# Patient Record
Sex: Male | Born: 1960 | Race: White | Hispanic: No | Marital: Married | Smoking: Never smoker
Health system: Southern US, Community
[De-identification: ages and names within clinical notes are randomized; demographics above are authoritative.]

## PROBLEM LIST (undated history)

## (undated) DIAGNOSIS — N200 Calculus of kidney: Secondary | ICD-10-CM

## (undated) DIAGNOSIS — K219 Gastro-esophageal reflux disease without esophagitis: Secondary | ICD-10-CM

## (undated) DIAGNOSIS — Z87442 Personal history of urinary calculi: Secondary | ICD-10-CM

## (undated) DIAGNOSIS — T8859XA Other complications of anesthesia, initial encounter: Secondary | ICD-10-CM

## (undated) DIAGNOSIS — I10 Essential (primary) hypertension: Secondary | ICD-10-CM

## (undated) HISTORY — PX: PROSTATE SURGERY: SHX751

## (undated) HISTORY — PX: LITHOTRIPSY: SUR834

---

## 2010-11-28 ENCOUNTER — Emergency Department (HOSPITAL_COMMUNITY)
Admission: EM | Admit: 2010-11-28 | Discharge: 2010-11-29 | Disposition: A | Discharge status: Home / Self Care | Payer: BC Managed Care – PPO | Attending: Emergency Medicine | Admitting: Emergency Medicine

## 2010-11-28 DIAGNOSIS — Z87442 Personal history of urinary calculi: Secondary | ICD-10-CM | POA: Insufficient documentation

## 2010-11-28 DIAGNOSIS — R339 Retention of urine, unspecified: Secondary | ICD-10-CM | POA: Insufficient documentation

## 2010-11-28 DIAGNOSIS — N390 Urinary tract infection, site not specified: Secondary | ICD-10-CM | POA: Insufficient documentation

## 2010-11-28 LAB — URINALYSIS, ROUTINE W REFLEX MICROSCOPIC
Glucose, UA: NEGATIVE mg/dL
Ketones, ur: NEGATIVE mg/dL
Nitrite: POSITIVE — AB
Protein, ur: NEGATIVE mg/dL

## 2010-11-28 LAB — URINE MICROSCOPIC-ADD ON

## 2010-12-01 LAB — URINE CULTURE: Culture  Setup Time: 201207250405

## 2010-12-03 ENCOUNTER — Emergency Department (INDEPENDENT_AMBULATORY_CARE_PROVIDER_SITE_OTHER): Payer: BC Managed Care – PPO

## 2010-12-03 ENCOUNTER — Emergency Department (HOSPITAL_BASED_OUTPATIENT_CLINIC_OR_DEPARTMENT_OTHER)
Admission: EM | Admit: 2010-12-03 | Discharge: 2010-12-04 | Disposition: A | Discharge status: Home / Self Care | Payer: BC Managed Care – PPO | Attending: Emergency Medicine | Admitting: Emergency Medicine

## 2010-12-03 ENCOUNTER — Encounter: Payer: Self-pay | Admitting: *Deleted

## 2010-12-03 DIAGNOSIS — N1330 Unspecified hydronephrosis: Secondary | ICD-10-CM

## 2010-12-03 DIAGNOSIS — N200 Calculus of kidney: Secondary | ICD-10-CM

## 2010-12-03 DIAGNOSIS — M549 Dorsalgia, unspecified: Secondary | ICD-10-CM | POA: Insufficient documentation

## 2010-12-03 DIAGNOSIS — R319 Hematuria, unspecified: Secondary | ICD-10-CM | POA: Insufficient documentation

## 2010-12-03 DIAGNOSIS — R109 Unspecified abdominal pain: Secondary | ICD-10-CM | POA: Insufficient documentation

## 2010-12-03 DIAGNOSIS — N201 Calculus of ureter: Secondary | ICD-10-CM

## 2010-12-03 HISTORY — DX: Calculus of kidney: N20.0

## 2010-12-03 LAB — DIFFERENTIAL
Eosinophils Absolute: 0.2 10*3/uL (ref 0.0–0.7)
Eosinophils Relative: 2 % (ref 0–5)
Lymphs Abs: 1.4 10*3/uL (ref 0.7–4.0)
Monocytes Absolute: 1.2 10*3/uL — ABNORMAL HIGH (ref 0.1–1.0)
Monocytes Relative: 13 % — ABNORMAL HIGH (ref 3–12)

## 2010-12-03 LAB — BASIC METABOLIC PANEL
BUN: 15 mg/dL (ref 6–23)
CO2: 28 mEq/L (ref 19–32)
Calcium: 9.9 mg/dL (ref 8.4–10.5)
Creatinine, Ser: 1.4 mg/dL — ABNORMAL HIGH (ref 0.50–1.35)
GFR calc non Af Amer: 53 mL/min — ABNORMAL LOW (ref >60)
Glucose, Bld: 76 mg/dL (ref 70–99)

## 2010-12-03 LAB — CBC
HCT: 43.1 % (ref 39.0–52.0)
Hemoglobin: 14.8 g/dL (ref 13.0–17.0)
MCH: 28.6 pg (ref 26.0–34.0)
MCV: 83.4 fL (ref 78.0–100.0)
Platelets: 225 10*3/uL (ref 150–400)
RBC: 5.17 MIL/uL (ref 4.22–5.81)

## 2010-12-03 LAB — URINE MICROSCOPIC-ADD ON

## 2010-12-03 LAB — URINALYSIS, ROUTINE W REFLEX MICROSCOPIC
Bilirubin Urine: NEGATIVE
Glucose, UA: NEGATIVE mg/dL
Specific Gravity, Urine: 1.028 (ref 1.005–1.030)
Urobilinogen, UA: 0.2 mg/dL (ref 0.0–1.0)

## 2010-12-03 MED ORDER — ONDANSETRON HCL 4 MG PO TABS: 4.0000 mg | ORAL_TABLET | Freq: Four times a day (QID) | ORAL | Status: AC

## 2010-12-03 MED ORDER — HYDROMORPHONE HCL 1 MG/ML IJ SOLN
1.0000 mg | Freq: Once | INTRAMUSCULAR | Status: AC
  Administered 2010-12-03: 1 mg via INTRAVENOUS
  Filled 2010-12-03: qty 1

## 2010-12-03 MED ORDER — KETOROLAC TROMETHAMINE 30 MG/ML IJ SOLN
30.0000 mg | Freq: Once | INTRAMUSCULAR | Status: AC
  Administered 2010-12-03: 30 mg via INTRAVENOUS
  Filled 2010-12-03: qty 1

## 2010-12-03 MED ORDER — SODIUM CHLORIDE 0.9 % IV BOLUS (SEPSIS)
1000.0000 mL | Freq: Once | INTRAVENOUS | Status: AC
  Administered 2010-12-03: 1000 mL via INTRAVENOUS

## 2010-12-03 MED ORDER — ONDANSETRON HCL 4 MG/2ML IJ SOLN
4.0000 mg | Freq: Once | INTRAMUSCULAR | Status: AC
  Administered 2010-12-03: 4 mg via INTRAVENOUS
  Filled 2010-12-03: qty 2

## 2010-12-03 MED ORDER — DEXTROSE 5 % IV SOLN
1.0000 g | Freq: Once | INTRAVENOUS | Status: AC
  Administered 2010-12-04: 1 g via INTRAVENOUS
  Filled 2010-12-03 (×2): qty 1

## 2010-12-03 MED ORDER — OXYCODONE-ACETAMINOPHEN 5-325 MG PO TABS: 2.0000 | ORAL_TABLET | ORAL | Status: AC | PRN

## 2010-12-03 NOTE — ED Notes (Signed)
Pt unable to urinate at this time.  

## 2010-12-03 NOTE — ED Provider Notes (Signed)
History     Chief Complaint  Patient presents with  . Back Pain   HPI Comments: Extensive history of kidney stones, R flank pain for the past 12 hours, associated with nausea and hematuria.  Has had lithotripsy and cystoscopy in the past but not since 1990s.  No vomiting, abdominal pain, testicular pain.  Seen at [facility] on 7/24 and given cipro for UTI.  Over the past 2 weeks, has had difficulty voiding at times and his urologist had given him a foley for a week.  Able to void now.  No chest pain, SOB, fever, headache.    The history is provided by the patient.    Past Medical History  Diagnosis Date  . Kidney stones     Past Surgical History  Procedure Date  . Lithotripsy     No family history on file.  History  Substance Use Topics  . Smoking status: Never Smoker   . Smokeless tobacco: Not on file  . Alcohol Use: No      Review of Systems  Constitutional: Positive for activity change and fatigue. Negative for fever.  HENT: Negative for congestion and rhinorrhea.   Respiratory: Negative for cough, chest tightness and shortness of breath.   Cardiovascular: Negative for chest pain.  Gastrointestinal: Positive for nausea. Negative for vomiting and abdominal pain.  Genitourinary: Positive for dysuria, urgency, hematuria, flank pain and difficulty urinating. Negative for testicular pain.  Musculoskeletal: Positive for back pain.  Neurological: Negative for weakness and headaches.    Physical Exam  BP 135/70  Pulse 79  Temp(Src) 98.9 F (37.2 C) (Oral)  Resp 20  SpO2 98%  Physical Exam  Constitutional: He is oriented to person, place, and time. He appears well-developed and well-nourished. He appears distressed.  HENT:  Head: Normocephalic and atraumatic.  Mouth/Throat: Oropharynx is clear and moist. No oropharyngeal exudate.  Eyes: Conjunctivae are normal. Pupils are equal, round, and reactive to light.  Neck: Normal range of motion.  Cardiovascular: Normal rate,  regular rhythm and normal heart sounds.   Pulmonary/Chest: Effort normal and breath sounds normal. No respiratory distress.  Abdominal: Soft. There is no tenderness. There is no rebound and no guarding.  Genitourinary: Penis normal.       No testicular tenderness  Musculoskeletal: He exhibits tenderness.       R CVAT  Neurological: He is alert and oriented to person, place, and time. A cranial nerve deficit is present.  Skin: Skin is warm.    ED Course  Procedures  MDM Flank pain, history of stones, current UTI. Labs, IVF, symptom control, CT stone study.  Patient unable to provide urine sample.  Has recently been catheterized.  Foley was placed after several attempts.  Will discharge with foley in place.   CT shows obstructing 5x8 mm stone on R with moderate hydronephrosis.  Cr 1.4 without other values to compare.  D/w Dr. Laverle Patter on call for urology (patient of Dr. Adriana Mccallum).  States as long as patient continues cipro he should be covered for any urine infection. Follow up with Dr. Adriana Mccallum this week.   Results for orders placed during the hospital encounter of 12/03/10  URINALYSIS, ROUTINE W REFLEX MICROSCOPIC      Component Value Range   Color, Urine AMBER (*) YELLOW    Appearance CLOUDY (*) CLEAR    Specific Gravity, Urine 1.028  1.005 - 1.030    pH 5.5  5.0 - 8.0    Glucose, UA NEGATIVE  NEGATIVE (mg/dL)  Hgb urine dipstick LARGE (*) NEGATIVE    Bilirubin Urine NEGATIVE  NEGATIVE    Ketones, ur NEGATIVE  NEGATIVE (mg/dL)   Protein, ur 30 (*) NEGATIVE (mg/dL)   Urobilinogen, UA 0.2  0.0 - 1.0 (mg/dL)   Nitrite NEGATIVE  NEGATIVE    Leukocytes, UA SMALL (*) NEGATIVE   CBC      Component Value Range   WBC 9.7  4.0 - 10.5 (K/uL)   RBC 5.17  4.22 - 5.81 (MIL/uL)   Hemoglobin 14.8  13.0 - 17.0 (g/dL)   HCT 40.9  81.1 - 91.4 (%)   MCV 83.4  78.0 - 100.0 (fL)   MCH 28.6  26.0 - 34.0 (pg)   MCHC 34.3  30.0 - 36.0 (g/dL)   RDW 78.2  95.6 - 21.3 (%)   Platelets 225  150 - 400  (K/uL)  DIFFERENTIAL      Component Value Range   Neutrophils Relative 71  43 - 77 (%)   Neutro Abs 6.9  1.7 - 7.7 (K/uL)   Lymphocytes Relative 15  12 - 46 (%)   Lymphs Abs 1.4  0.7 - 4.0 (K/uL)   Monocytes Relative 13 (*) 3 - 12 (%)   Monocytes Absolute 1.2 (*) 0.1 - 1.0 (K/uL)   Eosinophils Relative 2  0 - 5 (%)   Eosinophils Absolute 0.2  0.0 - 0.7 (K/uL)   Basophils Relative 0  0 - 1 (%)   Basophils Absolute 0.0  0.0 - 0.1 (K/uL)  BASIC METABOLIC PANEL      Component Value Range   Sodium 142  135 - 145 (mEq/L)   Potassium 3.2 (*) 3.5 - 5.1 (mEq/L)   Chloride 103  96 - 112 (mEq/L)   CO2 28  19 - 32 (mEq/L)   Glucose, Bld 76  70 - 99 (mg/dL)   BUN 15  6 - 23 (mg/dL)   Creatinine, Ser 0.86 (*) 0.50 - 1.35 (mg/dL)   Calcium 9.9  8.4 - 57.8 (mg/dL)   GFR calc non Af Amer 53 (*) >60 (mL/min)   GFR calc Af Amer >60  >60 (mL/min)  URINE MICROSCOPIC-ADD ON      Component Value Range   Squamous Epithelial / LPF FEW (*) RARE    WBC, UA 3-6  <3 (WBC/hpf)   RBC / HPF TOO NUMEROUS TO COUNT  <3 (RBC/hpf)   Bacteria, UA FEW (*) RARE    Casts GRANULAR CAST (*) NEGATIVE    Crystals CA OXALATE CRYSTALS (*) NEGATIVE    Urine-Other AMORPHOUS URATES/PHOSPHATES     Ct Abdomen Pelvis Wo Contrast  12/03/2010  *RADIOLOGY REPORT*  Clinical Data: Acute right flank pain.  History of kidney stones.  CT ABDOMEN AND PELVIS WITHOUT CONTRAST  Technique:  Multidetector CT imaging of the abdomen and pelvis was performed following the standard protocol without intravenous contrast.  Comparison: None.  Findings: There is a 5 x 8 mm stone obstructing the right ureter at the L3-4 level.  There are multiple other right renal calculi. There is also a small stone in the midportion of the left kidney. No left hydronephrosis.  The liver, spleen, pancreas, adrenal glands, and appendix are normal.  Terminal ileum were is normal.  There are multiple diverticuli in the descending colon.  Prostate gland is slightly  prominent.  A single bubble of air is seen in the bladder.  Has the patient been catheterized?  IMPRESSION:  1.  Moderate right hydronephrosis due to a 5 x 8 mm  stone obstructing the mid right ureter. 2.  Bilateral renal calculi. 3.  Tiny bubble of air in the bladder which could be due to recent catheterization.  Bladder infection could also cause air in the bladder.  Original Report Authenticated By: Gwynn Burly, M.D.     Glynn Octave, MD 12/03/10 [phone]

## 2010-12-03 NOTE — ED Notes (Signed)
Attempted foley catheter x 2 - unsuccessful.

## 2010-12-03 NOTE — ED Notes (Signed)
Right flank pain since this am. Hx of kidney stones.  

## 2010-12-03 NOTE — ED Notes (Signed)
18Fr foley catheter inserted by B. Senaida Ores, RN

## 2010-12-04 MED ORDER — OXYCODONE-ACETAMINOPHEN 5-325 MG PO TABS
2.0000 | ORAL_TABLET | Freq: Once | ORAL | Status: AC
Start: 2010-12-04 — End: 2010-12-04
  Administered 2010-12-04: 2 via ORAL
  Filled 2010-12-04: qty 2

## 2010-12-07 ENCOUNTER — Ambulatory Visit (HOSPITAL_COMMUNITY): Payer: BC Managed Care – PPO

## 2010-12-07 ENCOUNTER — Ambulatory Visit (HOSPITAL_COMMUNITY)
Admission: RE | Admit: 2010-12-07 | Discharge: 2010-12-07 | Disposition: A | Discharge status: Home / Self Care | Payer: BC Managed Care – PPO | Source: Ambulatory Visit | Attending: Urology | Admitting: Urology

## 2010-12-07 DIAGNOSIS — K219 Gastro-esophageal reflux disease without esophagitis: Secondary | ICD-10-CM | POA: Insufficient documentation

## 2010-12-07 DIAGNOSIS — N201 Calculus of ureter: Secondary | ICD-10-CM | POA: Insufficient documentation

## 2011-01-09 ENCOUNTER — Encounter (HOSPITAL_BASED_OUTPATIENT_CLINIC_OR_DEPARTMENT_OTHER): Payer: Self-pay | Admitting: *Deleted

## 2011-01-09 ENCOUNTER — Emergency Department (HOSPITAL_BASED_OUTPATIENT_CLINIC_OR_DEPARTMENT_OTHER)
Admission: EM | Admit: 2011-01-09 | Discharge: 2011-01-10 | Disposition: A | Discharge status: Home / Self Care | Payer: BC Managed Care – PPO | Attending: Emergency Medicine | Admitting: Emergency Medicine

## 2011-01-09 DIAGNOSIS — R339 Retention of urine, unspecified: Secondary | ICD-10-CM | POA: Insufficient documentation

## 2011-01-09 LAB — URINE MICROSCOPIC-ADD ON

## 2011-01-09 LAB — URINALYSIS, ROUTINE W REFLEX MICROSCOPIC
Bilirubin Urine: NEGATIVE
Nitrite: NEGATIVE
Protein, ur: NEGATIVE mg/dL
Specific Gravity, Urine: 1.02 (ref 1.005–1.030)
Urobilinogen, UA: 0.2 mg/dL (ref 0.0–1.0)

## 2011-01-09 MED ORDER — LIDOCAINE HCL 2 % EX GEL
Freq: Once | CUTANEOUS | Status: AC
  Administered 2011-01-09: 23:00:00 via URETHRAL

## 2011-01-09 MED ORDER — LIDOCAINE HCL 2 % EX GEL
CUTANEOUS | Status: AC
  Filled 2011-01-09: qty 20

## 2011-01-09 NOTE — ED Notes (Signed)
Pt presents to ED with urinary retention since 12noon.  Pt has had recent lithotripsy.  Pt rates pain 10/10 onscale

## 2011-01-09 NOTE — ED Notes (Signed)
Inserted 14 Fr. Folly cath.  With  Good urine output

## 2011-01-09 NOTE — ED Notes (Signed)
C/o urinary retention since 12pm today. Pt has had episodes same as this in the past d/t kidney stones.

## 2011-01-09 NOTE — ED Notes (Signed)
Pt attempted to urinate but is still unable to void at this time.

## 2011-01-09 NOTE — ED Notes (Signed)
Upon opening chart noted LDA remained from previous visit,  Charted as removed as pt does not have line in place at this time

## 2011-01-09 NOTE — ED Provider Notes (Signed)
History     CSN: [id number] Arrival date & time: 01/09/2011  9:50 PM  Chief Complaint  Patient presents with  . Urinary Retention   HPI Pt reports he has had intermittent episodes of urinary retention for the last several weeks since having lithotripsy. He has had several catheters and UTIs. He is seeing Dr. Brunilda Payor for same. States unable to urinate since noon today, was having severe bladder pressure on arrival, relieved with catheter placed prior to my evaluation. States feeling much better now.  Past Medical History  Diagnosis Date  . Kidney stones   . Kidney stones     Past Surgical History  Procedure Date  . Lithotripsy   . Lithotripsy     History reviewed. No pertinent family history.  History  Substance Use Topics  . Smoking status: Never Smoker   . Smokeless tobacco: Not on file  . Alcohol Use: No      Review of Systems All other systems reviewed and are negative except as noted in HPI.   Physical Exam  BP 115/84  Pulse 91  Temp(Src) 98.7 F (37.1 C) (Oral)  Resp 18  SpO2 98%  Physical Exam  Nursing note and vitals reviewed. Constitutional: He is oriented to person, place, and time. He appears well-developed and well-nourished.  HENT:  Head: Normocephalic and atraumatic.  Eyes: EOM are normal. Pupils are equal, round, and reactive to light.  Neck: Normal range of motion. Neck supple.  Cardiovascular: Normal rate, normal heart sounds and intact distal pulses.   Pulmonary/Chest: Effort normal and breath sounds normal.  Abdominal: Bowel sounds are normal. He exhibits no distension. There is no tenderness.  Genitourinary:       Normal external genitalia  Musculoskeletal: Normal range of motion. He exhibits no edema and no tenderness.  Neurological: He is alert and oriented to person, place, and time. No cranial nerve deficit.  Skin: Skin is warm and dry. No rash noted.  Psychiatric: He has a normal mood and affect.    ED Course  Procedures  MDM Pt  drained about 500cc after catheter placed. UA does not appear to show recurrent UTI, just finished Levaquin about 5 days ago. Will send for culture. Place in leg bag and send for followup with Dr. Brunilda Payor.       Charles B. Bernette Mayers, MD 01/09/11 2344

## 2011-01-11 LAB — URINE CULTURE
Colony Count: NO GROWTH
Culture: NO GROWTH

## 2013-06-27 IMAGING — CT CT ABD-PELV W/O CM
2 of 4 series · 17 of 46 positions shown, 19 images · non-contrast
Comparison: None.

CLINICAL DATA: Acute right flank pain.  History of kidney stones.

CT ABDOMEN AND PELVIS WITHOUT CONTRAST
TECHNIQUE: Multidetector CT imaging of the abdomen and pelvis was
performed following the standard protocol without intravenous
contrast.

[Series 2: renal stone < 200 lbs 5.0 b31f · axial · 0.69mm/px · z∈[-476,-26]mm · 14 of 98 slices shown, 16 images]
[im 4/98  soft-tissue]
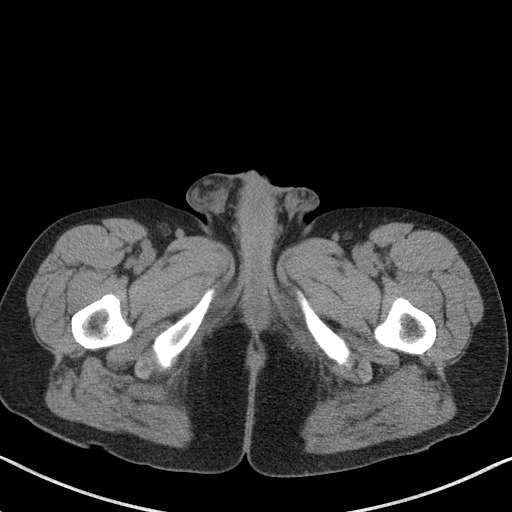
[im 4/98  bone]
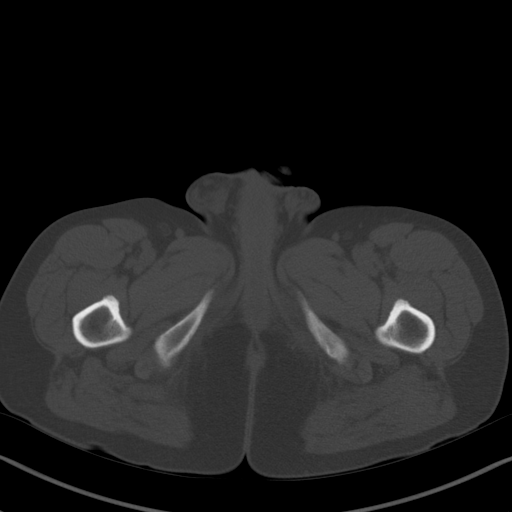
[im 12/98  soft-tissue]
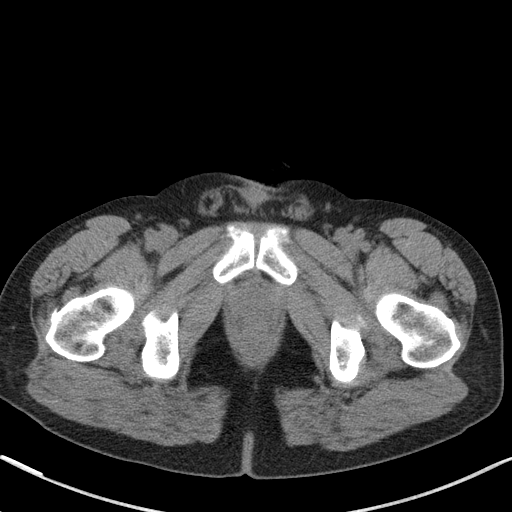
[im 20/98  soft-tissue]
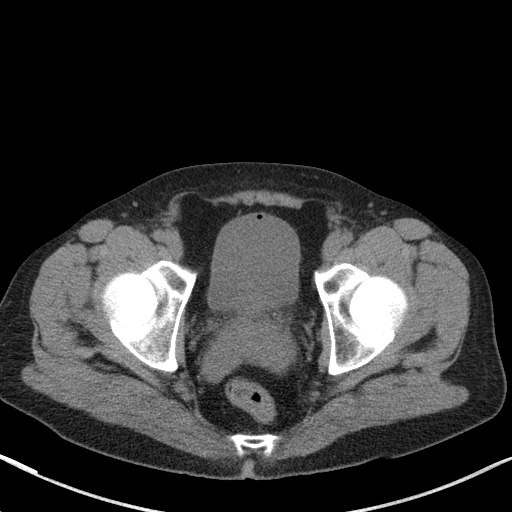
[im 28/98  soft-tissue]
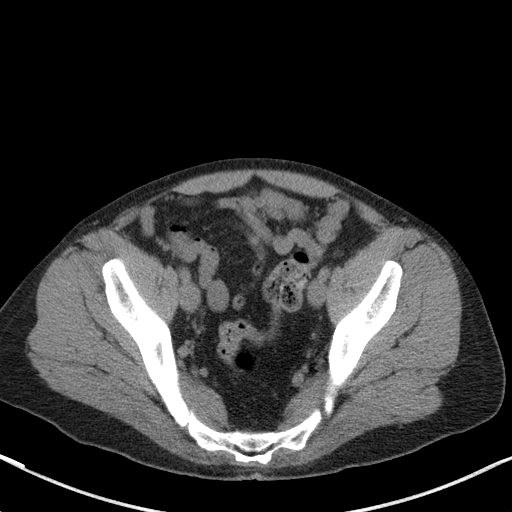
[im 32/98  soft-tissue]
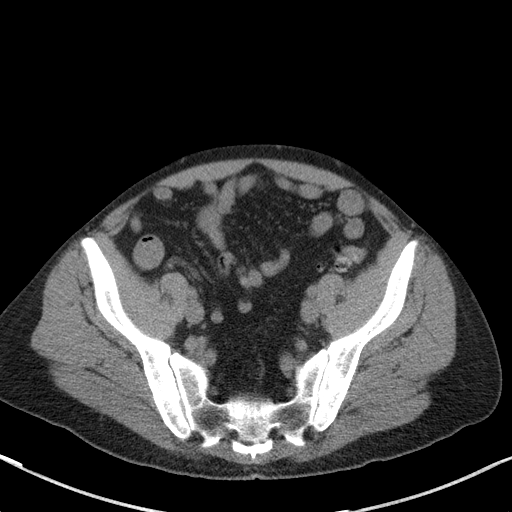
[im 39/98  soft-tissue]
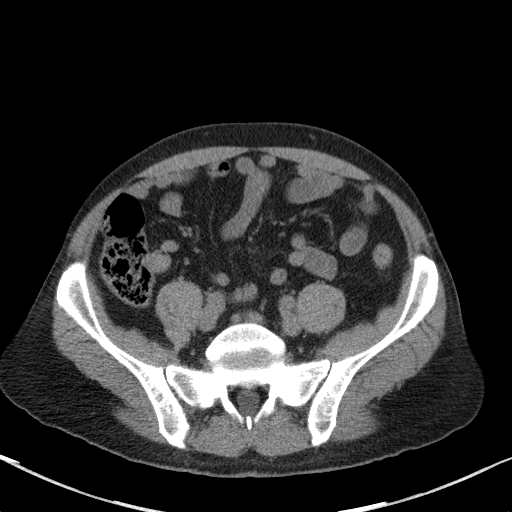
[im 47/98  soft-tissue]
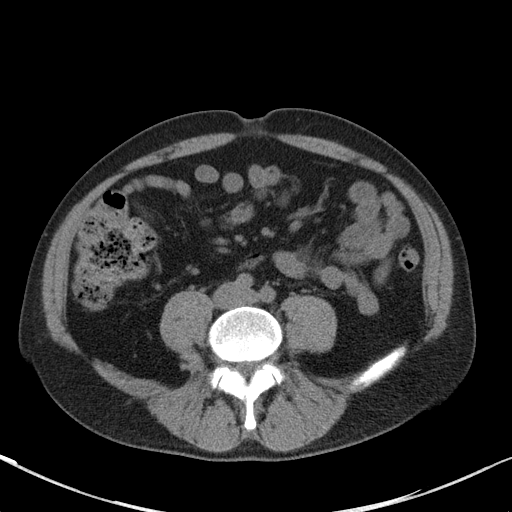
[im 51/98  soft-tissue]
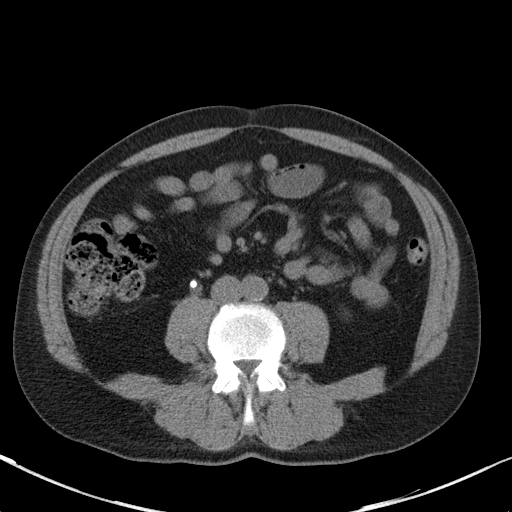
[im 59/98  soft-tissue]
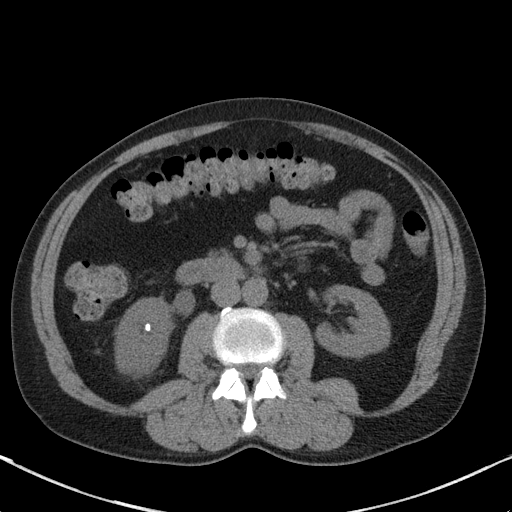
[im 59/98  bone]
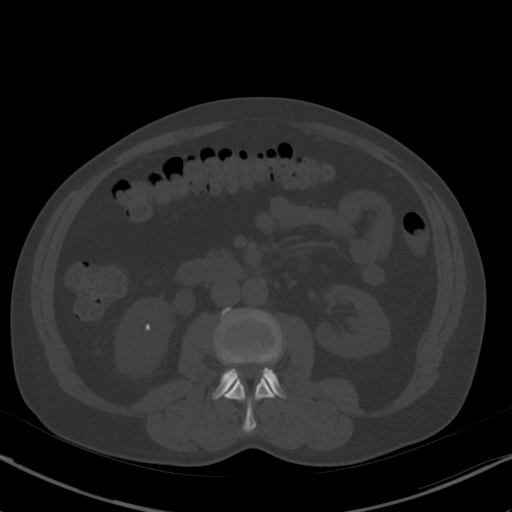
[im 66/98  soft-tissue]
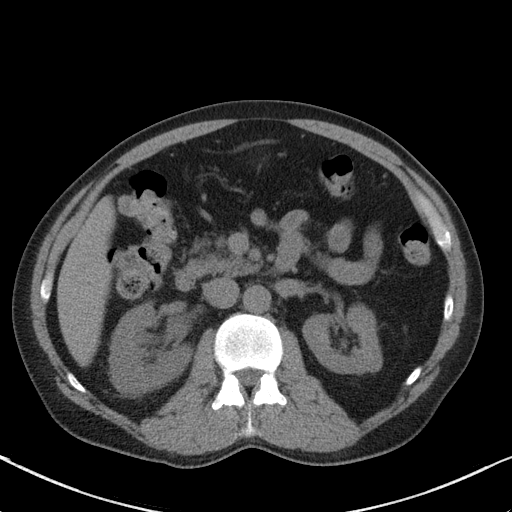
[im 74/98  soft-tissue]
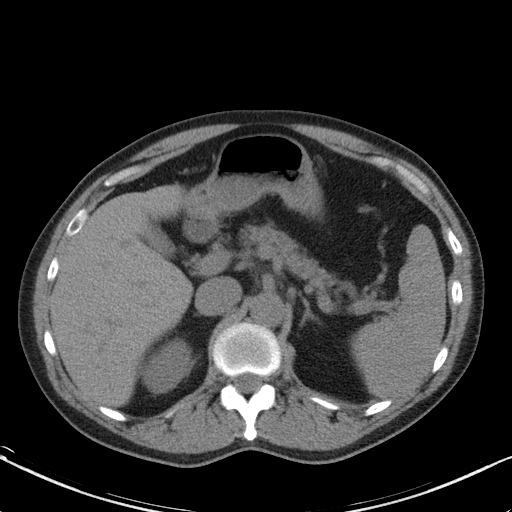
[im 78/98  soft-tissue]
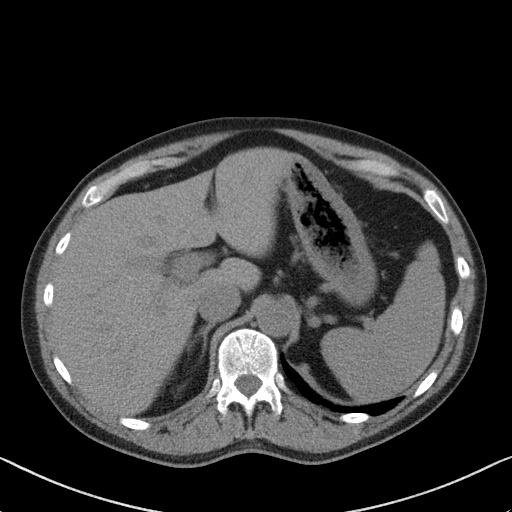
[im 86/98  soft-tissue]
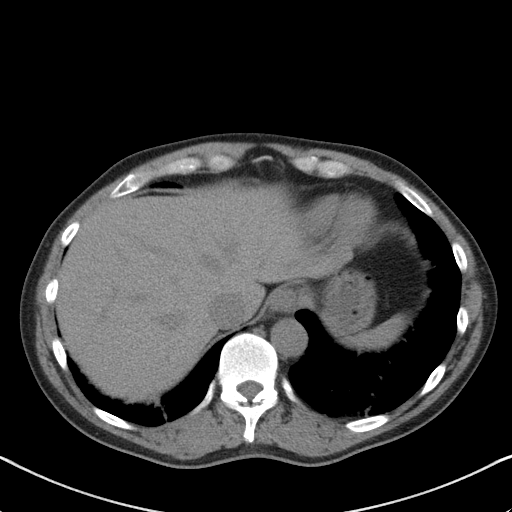
[im 94/98  soft-tissue]
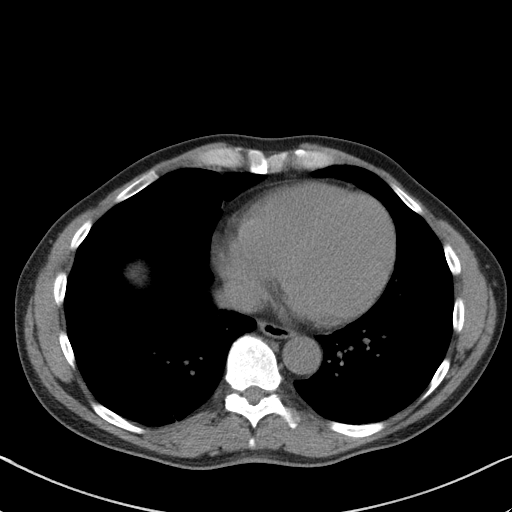

[Series 5: renal stone 3.0 coronal · coronal · 0.80mm/px · 3 of 87 slices shown]
[im 29/87  soft-tissue]
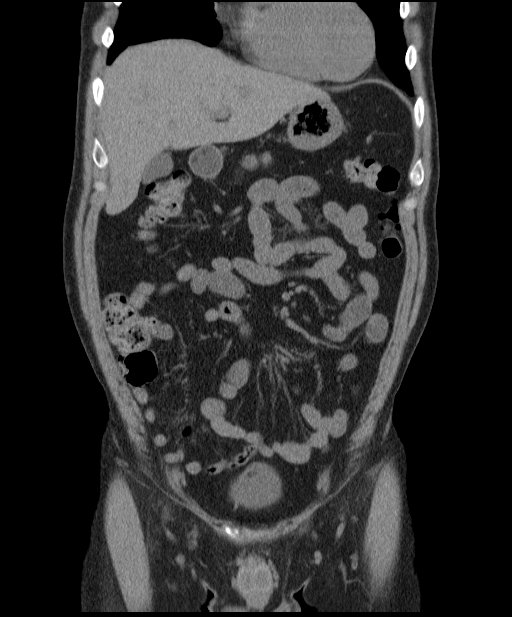
[im 39/87  soft-tissue]
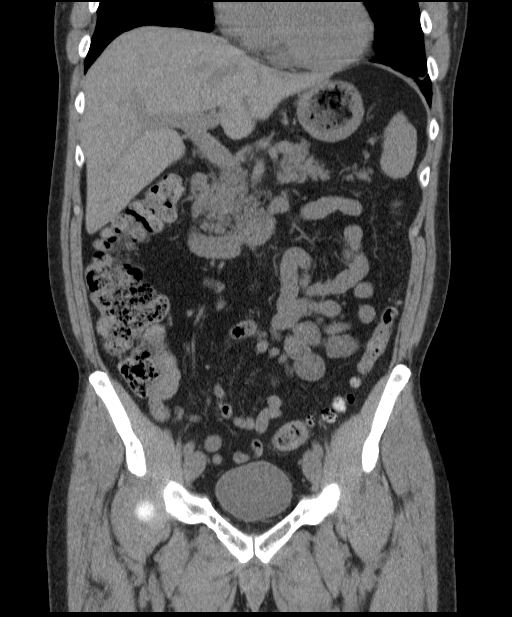
[im 48/87  soft-tissue]
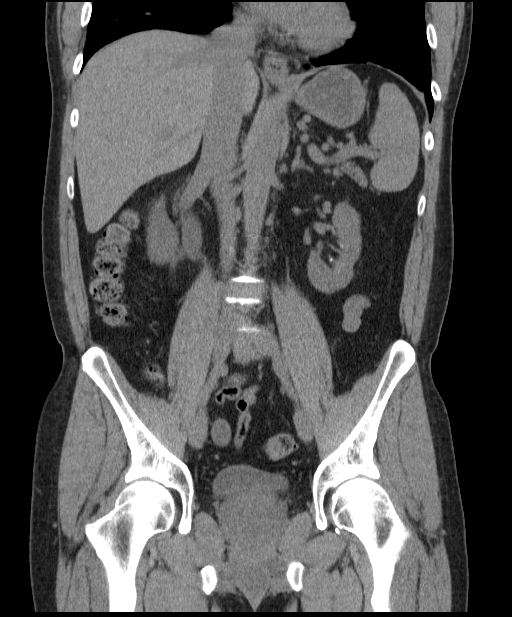

[17 of 46 positions shown; findings below may reference images not displayed]

FINDINGS: There is a 5 x 8 mm stone obstructing the right ureter at
the L3-4 level.  There are multiple other right renal calculi.
There is also a small stone in the midportion of the left kidney.
No left hydronephrosis.

The liver, spleen, pancreas, adrenal glands, and appendix are
normal.  Terminal ileum were is normal.

There are multiple diverticuli in the descending colon.

Prostate gland is slightly prominent.  A single bubble of air is
seen in the bladder.  Has the patient been catheterized?
IMPRESSION: 1.  Moderate right hydronephrosis due to a 5 x 8 mm stone
obstructing the mid right ureter.
2.  Bilateral renal calculi.
3.  Tiny bubble of air in the bladder which could be due to recent
catheterization.  Bladder infection could also cause air in the
bladder.

## 2017-10-16 DIAGNOSIS — Z23 Encounter for immunization: Secondary | ICD-10-CM | POA: Diagnosis not present

## 2017-10-22 DIAGNOSIS — N200 Calculus of kidney: Secondary | ICD-10-CM | POA: Diagnosis not present

## 2017-10-22 DIAGNOSIS — N401 Enlarged prostate with lower urinary tract symptoms: Secondary | ICD-10-CM | POA: Diagnosis not present

## 2017-11-19 DIAGNOSIS — R339 Retention of urine, unspecified: Secondary | ICD-10-CM | POA: Diagnosis not present

## 2017-11-19 DIAGNOSIS — Z79899 Other long term (current) drug therapy: Secondary | ICD-10-CM | POA: Diagnosis not present

## 2017-11-19 DIAGNOSIS — Z882 Allergy status to sulfonamides status: Secondary | ICD-10-CM | POA: Diagnosis not present

## 2017-11-19 DIAGNOSIS — R000 Tachycardia, unspecified: Secondary | ICD-10-CM | POA: Diagnosis not present

## 2017-11-28 DIAGNOSIS — N401 Enlarged prostate with lower urinary tract symptoms: Secondary | ICD-10-CM | POA: Diagnosis not present

## 2017-11-28 DIAGNOSIS — N320 Bladder-neck obstruction: Secondary | ICD-10-CM | POA: Diagnosis not present

## 2017-11-28 DIAGNOSIS — R339 Retention of urine, unspecified: Secondary | ICD-10-CM | POA: Diagnosis not present

## 2017-11-28 DIAGNOSIS — N138 Other obstructive and reflux uropathy: Secondary | ICD-10-CM | POA: Diagnosis not present

## 2017-11-28 DIAGNOSIS — Z79899 Other long term (current) drug therapy: Secondary | ICD-10-CM | POA: Diagnosis not present

## 2017-12-03 DIAGNOSIS — N401 Enlarged prostate with lower urinary tract symptoms: Secondary | ICD-10-CM | POA: Diagnosis not present

## 2017-12-03 DIAGNOSIS — R339 Retention of urine, unspecified: Secondary | ICD-10-CM | POA: Diagnosis not present

## 2017-12-03 DIAGNOSIS — N138 Other obstructive and reflux uropathy: Secondary | ICD-10-CM | POA: Diagnosis not present

## 2017-12-16 DIAGNOSIS — Z87442 Personal history of urinary calculi: Secondary | ICD-10-CM | POA: Diagnosis not present

## 2017-12-16 DIAGNOSIS — N401 Enlarged prostate with lower urinary tract symptoms: Secondary | ICD-10-CM | POA: Diagnosis not present

## 2017-12-16 DIAGNOSIS — N138 Other obstructive and reflux uropathy: Secondary | ICD-10-CM | POA: Diagnosis not present

## 2017-12-16 DIAGNOSIS — N200 Calculus of kidney: Secondary | ICD-10-CM | POA: Diagnosis not present

## 2018-01-01 DIAGNOSIS — N210 Calculus in bladder: Secondary | ICD-10-CM | POA: Diagnosis not present

## 2018-01-01 DIAGNOSIS — N401 Enlarged prostate with lower urinary tract symptoms: Secondary | ICD-10-CM | POA: Diagnosis not present

## 2018-01-01 DIAGNOSIS — N138 Other obstructive and reflux uropathy: Secondary | ICD-10-CM | POA: Diagnosis not present

## 2018-01-13 DIAGNOSIS — R338 Other retention of urine: Secondary | ICD-10-CM | POA: Diagnosis not present

## 2018-01-13 DIAGNOSIS — N138 Other obstructive and reflux uropathy: Secondary | ICD-10-CM | POA: Diagnosis not present

## 2018-01-13 DIAGNOSIS — Z87442 Personal history of urinary calculi: Secondary | ICD-10-CM | POA: Diagnosis not present

## 2018-01-13 DIAGNOSIS — N401 Enlarged prostate with lower urinary tract symptoms: Secondary | ICD-10-CM | POA: Diagnosis not present

## 2018-02-13 DIAGNOSIS — N390 Urinary tract infection, site not specified: Secondary | ICD-10-CM | POA: Diagnosis not present

## 2018-02-18 DIAGNOSIS — R3915 Urgency of urination: Secondary | ICD-10-CM | POA: Diagnosis not present

## 2018-04-01 DIAGNOSIS — J029 Acute pharyngitis, unspecified: Secondary | ICD-10-CM | POA: Diagnosis not present

## 2018-04-01 DIAGNOSIS — B349 Viral infection, unspecified: Secondary | ICD-10-CM | POA: Diagnosis not present

## 2018-04-15 DIAGNOSIS — R3915 Urgency of urination: Secondary | ICD-10-CM | POA: Diagnosis not present

## 2018-10-21 DIAGNOSIS — L821 Other seborrheic keratosis: Secondary | ICD-10-CM | POA: Diagnosis not present

## 2018-10-21 DIAGNOSIS — L570 Actinic keratosis: Secondary | ICD-10-CM | POA: Diagnosis not present

## 2018-10-21 DIAGNOSIS — L578 Other skin changes due to chronic exposure to nonionizing radiation: Secondary | ICD-10-CM | POA: Diagnosis not present

## 2018-10-21 DIAGNOSIS — L814 Other melanin hyperpigmentation: Secondary | ICD-10-CM | POA: Diagnosis not present

## 2018-10-21 DIAGNOSIS — D1801 Hemangioma of skin and subcutaneous tissue: Secondary | ICD-10-CM | POA: Diagnosis not present

## 2018-10-21 DIAGNOSIS — D485 Neoplasm of uncertain behavior of skin: Secondary | ICD-10-CM | POA: Diagnosis not present

## 2018-10-29 DIAGNOSIS — Z0000 Encounter for general adult medical examination without abnormal findings: Secondary | ICD-10-CM | POA: Diagnosis not present

## 2018-10-29 DIAGNOSIS — Z13220 Encounter for screening for lipoid disorders: Secondary | ICD-10-CM | POA: Diagnosis not present

## 2018-10-29 DIAGNOSIS — M25542 Pain in joints of left hand: Secondary | ICD-10-CM | POA: Diagnosis not present

## 2018-10-29 DIAGNOSIS — Z125 Encounter for screening for malignant neoplasm of prostate: Secondary | ICD-10-CM | POA: Diagnosis not present

## 2018-10-29 DIAGNOSIS — M25522 Pain in left elbow: Secondary | ICD-10-CM | POA: Diagnosis not present

## 2018-12-08 DIAGNOSIS — S96912A Strain of unspecified muscle and tendon at ankle and foot level, left foot, initial encounter: Secondary | ICD-10-CM | POA: Diagnosis not present

## 2019-04-28 ENCOUNTER — Ambulatory Visit: Payer: HRSA Program | Attending: Internal Medicine

## 2019-04-28 DIAGNOSIS — Z20822 Contact with and (suspected) exposure to covid-19: Secondary | ICD-10-CM

## 2019-04-28 DIAGNOSIS — Z20828 Contact with and (suspected) exposure to other viral communicable diseases: Secondary | ICD-10-CM | POA: Insufficient documentation

## 2019-04-30 LAB — NOVEL CORONAVIRUS, NAA: SARS-CoV-2, NAA: NOT DETECTED

## 2019-05-11 DIAGNOSIS — D1801 Hemangioma of skin and subcutaneous tissue: Secondary | ICD-10-CM | POA: Diagnosis not present

## 2019-05-11 DIAGNOSIS — L820 Inflamed seborrheic keratosis: Secondary | ICD-10-CM | POA: Diagnosis not present

## 2019-05-11 DIAGNOSIS — D485 Neoplasm of uncertain behavior of skin: Secondary | ICD-10-CM | POA: Diagnosis not present

## 2019-05-11 DIAGNOSIS — L821 Other seborrheic keratosis: Secondary | ICD-10-CM | POA: Diagnosis not present

## 2019-07-12 ENCOUNTER — Ambulatory Visit: Payer: Self-pay | Attending: Internal Medicine

## 2019-07-12 DIAGNOSIS — Z23 Encounter for immunization: Secondary | ICD-10-CM | POA: Insufficient documentation

## 2019-07-12 NOTE — Progress Notes (Signed)
   Covid-19 Vaccination Clinic  Name:  Luis Yoder    MRN: [id number] DOB: 04-07-1961  07/12/2019  [name] was observed post Covid-19 immunization for 30 minutes based on pre-vaccination screening without incident. He was provided with Vaccine Information Sheet and instruction to access the V-Safe system.   [name] was instructed to call 911 with any severe reactions post vaccine: Marland Kitchen Difficulty breathing  . Swelling of face and throat  . A fast heartbeat  . A bad rash all over body  . Dizziness and weakness   Immunizations Administered    Name Date Dose VIS Date Route   Pfizer COVID-19 Vaccine 07/12/2019 10:01 AM 0.3 mL 04/17/2019 Intramuscular   Manufacturer: [employer], [employer]   Lot: [id number]   NDC: [address]-7

## 2019-08-12 ENCOUNTER — Ambulatory Visit: Payer: Self-pay | Attending: Internal Medicine

## 2019-08-12 DIAGNOSIS — Z23 Encounter for immunization: Secondary | ICD-10-CM

## 2019-08-12 NOTE — Progress Notes (Signed)
   Covid-19 Vaccination Clinic  Name:  Kaire Stary    MRN: [id number] DOB: 01-02-1961  08/12/2019  Mr. Cupps was observed post Covid-19 immunization for 15 minutes without incident. He was provided with Vaccine Information Sheet and instruction to access the V-Safe system.   Mr. Zingg was instructed to call 911 with any severe reactions post vaccine: Marland Kitchen Difficulty breathing  . Swelling of face and throat  . A fast heartbeat  . A bad rash all over body  . Dizziness and weakness   Immunizations Administered    Name Date Dose VIS Date Route   Pfizer COVID-19 Vaccine 08/12/2019  8:26 AM 0.3 mL 04/17/2019 Intramuscular   Manufacturer: [employer], [employer]   Lot: [id number]   NDC: [address]-1

## 2019-11-02 DIAGNOSIS — Z1211 Encounter for screening for malignant neoplasm of colon: Secondary | ICD-10-CM | POA: Diagnosis not present

## 2019-11-02 DIAGNOSIS — N400 Enlarged prostate without lower urinary tract symptoms: Secondary | ICD-10-CM | POA: Diagnosis not present

## 2019-11-02 DIAGNOSIS — Z131 Encounter for screening for diabetes mellitus: Secondary | ICD-10-CM | POA: Diagnosis not present

## 2019-11-02 DIAGNOSIS — Z0000 Encounter for general adult medical examination without abnormal findings: Secondary | ICD-10-CM | POA: Diagnosis not present

## 2019-11-21 DIAGNOSIS — J0140 Acute pansinusitis, unspecified: Secondary | ICD-10-CM | POA: Diagnosis not present

## 2019-11-25 DIAGNOSIS — L820 Inflamed seborrheic keratosis: Secondary | ICD-10-CM | POA: Diagnosis not present

## 2020-05-12 DIAGNOSIS — Z20828 Contact with and (suspected) exposure to other viral communicable diseases: Secondary | ICD-10-CM | POA: Diagnosis not present

## 2020-06-10 DIAGNOSIS — Z01812 Encounter for preprocedural laboratory examination: Secondary | ICD-10-CM | POA: Diagnosis not present

## 2020-06-15 DIAGNOSIS — D123 Benign neoplasm of transverse colon: Secondary | ICD-10-CM | POA: Diagnosis not present

## 2020-06-15 DIAGNOSIS — K5730 Diverticulosis of large intestine without perforation or abscess without bleeding: Secondary | ICD-10-CM | POA: Diagnosis not present

## 2020-06-15 DIAGNOSIS — Z86010 Personal history of colonic polyps: Secondary | ICD-10-CM | POA: Diagnosis not present

## 2020-06-15 DIAGNOSIS — K648 Other hemorrhoids: Secondary | ICD-10-CM | POA: Diagnosis not present

## 2020-11-25 DIAGNOSIS — D485 Neoplasm of uncertain behavior of skin: Secondary | ICD-10-CM | POA: Diagnosis not present

## 2020-11-25 DIAGNOSIS — L578 Other skin changes due to chronic exposure to nonionizing radiation: Secondary | ICD-10-CM | POA: Diagnosis not present

## 2020-11-25 DIAGNOSIS — D1801 Hemangioma of skin and subcutaneous tissue: Secondary | ICD-10-CM | POA: Diagnosis not present

## 2020-11-25 DIAGNOSIS — L820 Inflamed seborrheic keratosis: Secondary | ICD-10-CM | POA: Diagnosis not present

## 2021-08-09 DIAGNOSIS — R2230 Localized swelling, mass and lump, unspecified upper limb: Secondary | ICD-10-CM | POA: Diagnosis not present

## 2021-08-09 DIAGNOSIS — R030 Elevated blood-pressure reading, without diagnosis of hypertension: Secondary | ICD-10-CM | POA: Diagnosis not present

## 2021-08-23 DIAGNOSIS — I10 Essential (primary) hypertension: Secondary | ICD-10-CM | POA: Diagnosis not present

## 2023-08-15 ENCOUNTER — Ambulatory Visit: Payer: Self-pay | Admitting: General Surgery

## 2023-08-30 ENCOUNTER — Encounter (HOSPITAL_COMMUNITY): Payer: Self-pay

## 2023-08-30 NOTE — Patient Instructions (Addendum)
 SURGICAL WAITING ROOM VISITATION  Patients having surgery or a procedure may have no more than 2 support people in the waiting area - these visitors may rotate.    Children under the age of 20 must have an adult with them who is not the patient.  Due to an increase in RSV and influenza rates and associated hospitalizations, children ages 52 and under may not visit patients in [facility] hospitals.  Visitors with respiratory illnesses are discouraged from visiting and should remain at home.  If the patient needs to stay at the hospital during part of their recovery, the visitor guidelines for inpatient rooms apply. Pre-op nurse will coordinate an appropriate time for 1 support person to accompany patient in pre-op.  This support person may not rotate.    Please refer to the [facility] website for the visitor guidelines for Inpatients (after your surgery is over and you are in a regular room).       Your procedure is scheduled on: 09-09-23   Report to [facility] Main Entrance    Report to admitting at      11:45  AM   Call this number if you have problems the morning of surgery [phone]   Do not eat food :After Midnight.   After Midnight you may have the following liquids until 11:00_____ AM/  DAY OF SURGERY  then nothing by mouth  Water Non-Citrus Juices (without pulp, NO RED-Apple, White grape, White cranberry) Black Coffee (NO MILK/CREAM OR CREAMERS, sugar ok)  Clear Tea (NO MILK/CREAM OR CREAMERS, sugar ok) regular and decaf                             Plain Jell-O (NO RED)                                           Fruit ices (not with fruit pulp, NO RED)                                     Popsicles (NO RED)                                                               Sports drinks like Gatorade (NO RED)                         If you have questions, please contact your surgeon's office.   FOLLOW ANY ADDITIONAL PRE OP INSTRUCTIONS YOU RECEIVED FROM YOUR  SURGEON'S OFFICE!!!     Oral Hygiene is also important to reduce your risk of infection.                                    Remember - BRUSH YOUR TEETH THE MORNING OF SURGERY WITH YOUR REGULAR TOOTHPASTE  DENTURES WILL BE REMOVED PRIOR TO SURGERY PLEASE DO NOT APPLY "Poly grip" OR ADHESIVES!!!   Do NOT smoke after Midnight   Stop all vitamins  and herbal supplements 7 days before surgery.   Take these medicines the morning of surgery with A SIP OF WATER: NONE  Bring CPAP mask and tubing day of surgery.                              You may not have any metal on your body including hair pins, jewelry, and body piercing             Do not wear  lotions, powders, cologne, or deodorant                 Men may shave face and neck.   Do not bring valuables to the hospital. [facility] IS NOT             RESPONSIBLE   FOR VALUABLES.   Contacts, glasses, dentures or bridgework may not be worn into surgery.   Bring small overnight bag day of surgery.   DO NOT BRING YOUR HOME MEDICATIONS TO THE HOSPITAL. PHARMACY WILL DISPENSE MEDICATIONS LISTED ON YOUR MEDICATION LIST TO YOU DURING YOUR ADMISSION IN THE HOSPITAL!    Patients discharged on the day of surgery will not be allowed to drive home.  Someone NEEDS to stay with you for the first 24 hours after anesthesia.   Special Instructions: Bring a copy of your healthcare power of attorney and living will documents the day of surgery if you haven't scanned them before.              Please read over the following fact sheets you were given: IF YOU HAVE QUESTIONS ABOUT YOUR PRE-OP INSTRUCTIONS PLEASE CALL [phone]    If you test positive for Covid or have been in contact with anyone that has tested positive in the last 10 days please notify you surgeon.    [facility] - Preparing for Surgery Before surgery, you can play an important role.  Because skin is not sterile, your skin needs to be as free of germs as possible.  You can reduce  the number of germs on your skin by washing with CHG (chlorahexidine gluconate) soap before surgery.  CHG is an antiseptic cleaner which kills germs and bonds with the skin to continue killing germs even after washing. Please DO NOT use if you have an allergy to CHG or antibacterial soaps.  If your skin becomes reddened/irritated stop using the CHG and inform your nurse when you arrive at Short Stay. Do not shave (including legs and underarms) for at least 48 hours prior to the first CHG shower.  You may shave your face/neck. Please follow these instructions carefully:  1.  Shower with CHG Soap the night before surgery and the  morning of Surgery.  2.  If you choose to wash your hair, wash your hair first as usual with your  normal  shampoo.  3.  After you shampoo, rinse your hair and body thoroughly to remove the  shampoo.                           4.  Use CHG as you would any other liquid soap.  You can apply chg directly  to the skin and wash                       Gently with a scrungie or clean washcloth.  5.  Apply the CHG Soap  to your body ONLY FROM THE NECK DOWN.   Do not use on face/ open                           Wound or open sores. Avoid contact with eyes, ears mouth and genitals (private parts).                       Wash face,  Genitals (private parts) with your normal soap.             6.  Wash thoroughly, paying special attention to the area where your surgery  will be performed.  7.  Thoroughly rinse your body with warm water from the neck down.  8.  DO NOT shower/wash with your normal soap after using and rinsing off  the CHG Soap.                9.  Pat yourself dry with a clean towel.            10.  Wear clean pajamas.            11.  Place clean sheets on your bed the night of your first shower and do not  sleep with pets. Day of Surgery : Do not apply any lotions/deodorants the morning of surgery.  Please wear clean clothes to the hospital/surgery center.  FAILURE TO FOLLOW  THESE INSTRUCTIONS MAY RESULT IN THE CANCELLATION OF YOUR SURGERY PATIENT SIGNATURE_________________________________  NURSE SIGNATURE__________________________________  ________________________________________________________________________

## 2023-08-30 NOTE — Progress Notes (Addendum)
 PCP - [facility] , NP Cardiologist - no  PPM/ICD -  Device Orders -  Rep Notified -   Chest x-ray -  EKG - 09-06-23  epic Stress Test -  ECHO -  Cardiac Cath -   Sleep Study -  CPAP -   Fasting Blood Sugar -  Checks Blood Sugar _____ times a day  Blood Thinner Instructions: Aspirin Instructions:  ERAS Protcol -Y PRE-SURGERY no   COVID vaccine -yes  Activity--Able to climb a flight of stairs with no CP or SOB Anesthesia review: HTN  Patient denies shortness of breath, fever, cough and chest pain at PAT appointment   All instructions explained to the patient, with a verbal understanding of the material. Patient agrees to go over the instructions while at home for a better understanding. Patient also instructed to self quarantine after being tested for COVID-19. The opportunity to ask questions was provided.

## 2023-09-06 ENCOUNTER — Other Ambulatory Visit: Payer: Self-pay

## 2023-09-06 ENCOUNTER — Encounter (HOSPITAL_COMMUNITY)
Admission: RE | Admit: 2023-09-06 | Discharge: 2023-09-06 | Disposition: A | Discharge status: Home / Self Care | Payer: Self-pay | Source: Ambulatory Visit | Attending: General Surgery

## 2023-09-06 ENCOUNTER — Encounter (HOSPITAL_COMMUNITY): Payer: Self-pay

## 2023-09-06 VITALS — BP 113/64 | HR 70 | Temp 98.5°F | Resp 16 | Ht 69.0 in | Wt 181.0 lb

## 2023-09-06 DIAGNOSIS — K4090 Unilateral inguinal hernia, without obstruction or gangrene, not specified as recurrent: Secondary | ICD-10-CM | POA: Diagnosis present

## 2023-09-06 DIAGNOSIS — K429 Umbilical hernia without obstruction or gangrene: Secondary | ICD-10-CM | POA: Diagnosis not present

## 2023-09-06 DIAGNOSIS — K219 Gastro-esophageal reflux disease without esophagitis: Secondary | ICD-10-CM | POA: Diagnosis not present

## 2023-09-06 DIAGNOSIS — I10 Essential (primary) hypertension: Secondary | ICD-10-CM | POA: Insufficient documentation

## 2023-09-06 DIAGNOSIS — Z79899 Other long term (current) drug therapy: Secondary | ICD-10-CM | POA: Diagnosis not present

## 2023-09-06 DIAGNOSIS — Z01818 Encounter for other preprocedural examination: Secondary | ICD-10-CM | POA: Insufficient documentation

## 2023-09-06 HISTORY — DX: Other complications of anesthesia, initial encounter: T88.59XA

## 2023-09-06 HISTORY — DX: Personal history of urinary calculi: Z87.442

## 2023-09-06 HISTORY — DX: Gastro-esophageal reflux disease without esophagitis: K21.9

## 2023-09-06 HISTORY — DX: Essential (primary) hypertension: I10

## 2023-09-06 LAB — BASIC METABOLIC PANEL WITH GFR
Anion gap: 7 (ref 5–15)
BUN: 11 mg/dL (ref 8–23)
CO2: 25 mmol/L (ref 22–32)
Calcium: 9 mg/dL (ref 8.9–10.3)
Chloride: 107 mmol/L (ref 98–111)
Creatinine, Ser: 1.23 mg/dL (ref 0.61–1.24)
GFR, Estimated: >60 mL/min (ref >60)
Glucose, Bld: 106 mg/dL — ABNORMAL HIGH (ref 70–99)
Potassium: 4.3 mmol/L (ref 3.5–5.1)
Sodium: 139 mmol/L (ref 135–145)

## 2023-09-06 LAB — CBC
HCT: 50.4 % (ref 39.0–52.0)
Hemoglobin: 16.1 g/dL (ref 13.0–17.0)
MCH: 28.6 pg (ref 26.0–34.0)
MCHC: 31.9 g/dL (ref 30.0–36.0)
MCV: 89.5 fL (ref 80.0–100.0)
Platelets: 208 10*3/uL (ref 150–400)
RBC: 5.63 MIL/uL (ref 4.22–5.81)
RDW: 13 % (ref 11.5–15.5)
WBC: 6.8 10*3/uL (ref 4.0–10.5)
nRBC: 0 % (ref 0.0–0.2)

## 2023-09-08 NOTE — Anesthesia Preprocedure Evaluation (Signed)
 Anesthesia Evaluation  Patient identified by MRN, date of birth, ID band Patient awake    Reviewed: Allergy & Precautions, NPO status , Patient's Chart, lab work & pertinent test results  History of Anesthesia Complications (+) history of anesthetic complications  Airway Mallampati: II  TM Distance: >3 FB Neck ROM: Full    Dental no notable dental hx. (+) Caps, Implants, Teeth Intact   Pulmonary    Pulmonary exam normal breath sounds clear to auscultation       Cardiovascular Exercise Tolerance: Good hypertension, (-) angina (-) Past MI Normal cardiovascular exam Rhythm:Regular Rate:Normal     Neuro/Psych    GI/Hepatic ,GERD  Medicated and Controlled,,  Endo/Other    Renal/GU Lab Results      Component                Value               Date                      NA                       139                 09/06/2023                CL                       107                 09/06/2023                K                        4.3                 09/06/2023                CO2                      25                  09/06/2023                BUN                      11                  09/06/2023                CREATININE               1.23                09/06/2023                GFRNONAA                 >60                 09/06/2023                CALCIUM                  9.0                 09/06/2023  GLUCOSE                  106 (H)             09/06/2023                Musculoskeletal   Abdominal   Peds  Hematology Lab Results      Component                Value               Date                      WBC                      6.8                 09/06/2023                HGB                      16.1                09/06/2023                HCT                      50.4                09/06/2023                MCV                      89.5                09/06/2023                PLT                       208                 09/06/2023              Anesthesia Other Findings All: Sulfa  Reproductive/Obstetrics                             Anesthesia Physical Anesthesia Plan  ASA: 2  Anesthesia Plan: General   Post-op Pain Management: Tylenol  PO (pre-op)* and Toradol  IV (intra-op)*   Induction: Intravenous  PONV Risk Score and Plan: Treatment may vary due to age or medical condition, Ondansetron  and Dexamethasone  Airway Management Planned: Oral ETT  Additional Equipment: None  Intra-op Plan:   Post-operative Plan: Extubation in OR  Informed Consent: I have reviewed the patients History and Physical, chart, labs and discussed the procedure including the risks, benefits and alternatives for the proposed anesthesia with the patient or authorized representative who has indicated his/her understanding and acceptance.     Dental advisory given  Plan Discussed with: CRNA and Surgeon  Anesthesia Plan Comments:        Anesthesia Quick Evaluation

## 2023-09-09 ENCOUNTER — Encounter (HOSPITAL_COMMUNITY)
Admission: RE | Disposition: A | Discharge status: Home / Self Care | Payer: Self-pay | Source: Home / Self Care | Attending: General Surgery

## 2023-09-09 ENCOUNTER — Ambulatory Visit (HOSPITAL_COMMUNITY)
Admission: RE | Admit: 2023-09-09 | Discharge: 2023-09-09 | Disposition: A | Discharge status: Home / Self Care | Payer: Self-pay | Attending: General Surgery | Admitting: General Surgery

## 2023-09-09 ENCOUNTER — Ambulatory Visit (HOSPITAL_COMMUNITY): Payer: Self-pay | Admitting: Anesthesiology

## 2023-09-09 ENCOUNTER — Other Ambulatory Visit: Payer: Self-pay

## 2023-09-09 ENCOUNTER — Encounter (HOSPITAL_COMMUNITY): Payer: Self-pay | Admitting: General Surgery

## 2023-09-09 DIAGNOSIS — K219 Gastro-esophageal reflux disease without esophagitis: Secondary | ICD-10-CM | POA: Insufficient documentation

## 2023-09-09 DIAGNOSIS — K429 Umbilical hernia without obstruction or gangrene: Secondary | ICD-10-CM | POA: Insufficient documentation

## 2023-09-09 DIAGNOSIS — K4090 Unilateral inguinal hernia, without obstruction or gangrene, not specified as recurrent: Secondary | ICD-10-CM | POA: Diagnosis not present

## 2023-09-09 DIAGNOSIS — Z79899 Other long term (current) drug therapy: Secondary | ICD-10-CM | POA: Insufficient documentation

## 2023-09-09 DIAGNOSIS — I10 Essential (primary) hypertension: Secondary | ICD-10-CM | POA: Insufficient documentation

## 2023-09-09 HISTORY — PX: XI ROBOTIC ASSISTED INGUINAL HERNIA REPAIR WITH MESH: SHX6706

## 2023-09-09 HISTORY — PX: UMBILICAL HERNIA REPAIR: SHX196

## 2023-09-09 SURGERY — REPAIR, HERNIA, INGUINAL, ROBOT-ASSISTED, LAPAROSCOPIC, USING MESH
Anesthesia: General

## 2023-09-09 MED ORDER — PHENYLEPHRINE 80 MCG/ML (10ML) SYRINGE FOR IV PUSH (FOR BLOOD PRESSURE SUPPORT)
PREFILLED_SYRINGE | INTRAVENOUS | Status: DC | PRN
Start: 2023-09-09 — End: 2023-09-09
  Administered 2023-09-09: 160 ug via INTRAVENOUS

## 2023-09-09 MED ORDER — OXYCODONE HCL 5 MG/5ML PO SOLN: 5.0000 mg | Freq: Once | ORAL | Status: DC | PRN

## 2023-09-09 MED ORDER — ROCURONIUM BROMIDE 10 MG/ML (PF) SYRINGE
PREFILLED_SYRINGE | INTRAVENOUS | Status: AC
  Filled 2023-09-09: qty 10

## 2023-09-09 MED ORDER — FENTANYL CITRATE (PF) 100 MCG/2ML IJ SOLN
INTRAMUSCULAR | Status: DC | PRN
Start: 2023-09-09 — End: 2023-09-09
  Administered 2023-09-09 (×2): 50 ug via INTRAVENOUS

## 2023-09-09 MED ORDER — BUPIVACAINE LIPOSOME 1.3 % IJ SUSP
INTRAMUSCULAR | Status: AC
  Filled 2023-09-09: qty 20

## 2023-09-09 MED ORDER — ONDANSETRON HCL 4 MG/2ML IJ SOLN
INTRAMUSCULAR | Status: AC
  Filled 2023-09-09: qty 2

## 2023-09-09 MED ORDER — FENTANYL CITRATE (PF) 100 MCG/2ML IJ SOLN
INTRAMUSCULAR | Status: AC
  Filled 2023-09-09: qty 2

## 2023-09-09 MED ORDER — PROPOFOL 10 MG/ML IV BOLUS
INTRAVENOUS | Status: AC
  Filled 2023-09-09: qty 20

## 2023-09-09 MED ORDER — BUPIVACAINE-EPINEPHRINE (PF) 0.25% -1:200000 IJ SOLN
INTRAMUSCULAR | Status: DC | PRN
  Administered 2023-09-09: 50 mL

## 2023-09-09 MED ORDER — PHENYLEPHRINE 80 MCG/ML (10ML) SYRINGE FOR IV PUSH (FOR BLOOD PRESSURE SUPPORT)
PREFILLED_SYRINGE | INTRAVENOUS | Status: AC
  Filled 2023-09-09: qty 10

## 2023-09-09 MED ORDER — GABAPENTIN 300 MG PO CAPS
300.0000 mg | ORAL_CAPSULE | ORAL | Status: AC
  Administered 2023-09-09: 300 mg via ORAL
  Filled 2023-09-09: qty 1

## 2023-09-09 MED ORDER — BUPIVACAINE-EPINEPHRINE (PF) 0.25% -1:200000 IJ SOLN
INTRAMUSCULAR | Status: AC
  Filled 2023-09-09: qty 30

## 2023-09-09 MED ORDER — MIDAZOLAM HCL 5 MG/5ML IJ SOLN
INTRAMUSCULAR | Status: DC | PRN
  Administered 2023-09-09: 2 mg via INTRAVENOUS

## 2023-09-09 MED ORDER — DEXAMETHASONE SODIUM PHOSPHATE 10 MG/ML IJ SOLN
INTRAMUSCULAR | Status: DC | PRN
  Administered 2023-09-09: 10 mg via INTRAVENOUS

## 2023-09-09 MED ORDER — OXYCODONE HCL 5 MG PO TABS: 5.0000 mg | ORAL_TABLET | Freq: Three times a day (TID) | ORAL | 0 refills | Status: AC | PRN

## 2023-09-09 MED ORDER — CEFAZOLIN SODIUM-DEXTROSE 2-4 GM/100ML-% IV SOLN
2.0000 g | INTRAVENOUS | Status: AC
  Administered 2023-09-09: 2 g via INTRAVENOUS
  Filled 2023-09-09: qty 100

## 2023-09-09 MED ORDER — HYDROMORPHONE HCL 2 MG/ML IJ SOLN
INTRAMUSCULAR | Status: AC
  Filled 2023-09-09: qty 1

## 2023-09-09 MED ORDER — LACTATED RINGERS IV SOLN: INTRAVENOUS | Status: DC

## 2023-09-09 MED ORDER — SUGAMMADEX SODIUM 200 MG/2ML IV SOLN
INTRAVENOUS | Status: DC | PRN
  Administered 2023-09-09: 200 mg via INTRAVENOUS

## 2023-09-09 MED ORDER — ACETAMINOPHEN 325 MG PO TABS: 650.0000 mg | ORAL_TABLET | Freq: Four times a day (QID) | ORAL | 0 refills | Status: AC

## 2023-09-09 MED ORDER — ROCURONIUM BROMIDE 100 MG/10ML IV SOLN
INTRAVENOUS | Status: DC | PRN
  Administered 2023-09-09: 55 mg via INTRAVENOUS
  Administered 2023-09-09: 20 mg via INTRAVENOUS

## 2023-09-09 MED ORDER — PROPOFOL 10 MG/ML IV BOLUS
INTRAVENOUS | Status: DC | PRN
  Administered 2023-09-09: 150 mg via INTRAVENOUS

## 2023-09-09 MED ORDER — MIDAZOLAM HCL 2 MG/2ML IJ SOLN
INTRAMUSCULAR | Status: AC
  Filled 2023-09-09: qty 2

## 2023-09-09 MED ORDER — CHLORHEXIDINE GLUCONATE CLOTH 2 % EX PADS: 6.0000 | MEDICATED_PAD | Freq: Once | CUTANEOUS | Status: DC

## 2023-09-09 MED ORDER — ONDANSETRON HCL 4 MG/2ML IJ SOLN: 4.0000 mg | Freq: Once | INTRAMUSCULAR | Status: DC | PRN

## 2023-09-09 MED ORDER — ORAL CARE MOUTH RINSE: 15.0000 mL | Freq: Once | OROMUCOSAL | Status: DC

## 2023-09-09 MED ORDER — LIDOCAINE HCL (CARDIAC) PF 100 MG/5ML IV SOSY
PREFILLED_SYRINGE | INTRAVENOUS | Status: DC | PRN
  Administered 2023-09-09: 100 mg via INTRAVENOUS

## 2023-09-09 MED ORDER — ACETAMINOPHEN 500 MG PO TABS
1000.0000 mg | ORAL_TABLET | ORAL | Status: AC
  Administered 2023-09-09: 1000 mg via ORAL
  Filled 2023-09-09: qty 2

## 2023-09-09 MED ORDER — HYDROMORPHONE HCL 1 MG/ML IJ SOLN
INTRAMUSCULAR | Status: DC | PRN
  Administered 2023-09-09 (×2): .5 mg via INTRAVENOUS

## 2023-09-09 MED ORDER — BUPIVACAINE LIPOSOME 1.3 % IJ SUSP: 20.0000 mL | Freq: Once | INTRAMUSCULAR | Status: DC

## 2023-09-09 MED ORDER — LACTATED RINGERS IV SOLN: INTRAVENOUS | Status: DC | PRN

## 2023-09-09 MED ORDER — KETOROLAC TROMETHAMINE 30 MG/ML IJ SOLN: 30.0000 mg | Freq: Once | INTRAMUSCULAR | Status: DC | PRN

## 2023-09-09 MED ORDER — LIDOCAINE HCL (PF) 2 % IJ SOLN
INTRAMUSCULAR | Status: AC
Start: 2023-09-09 — End: ?
  Filled 2023-09-09: qty 5

## 2023-09-09 MED ORDER — CHLORHEXIDINE GLUCONATE 0.12 % MT SOLN: 15.0000 mL | Freq: Once | OROMUCOSAL | Status: DC

## 2023-09-09 MED ORDER — 0.9 % SODIUM CHLORIDE (POUR BTL) OPTIME
TOPICAL | Status: DC | PRN
  Administered 2023-09-09: 1000 mL

## 2023-09-09 MED ORDER — OXYCODONE HCL 5 MG PO TABS: 5.0000 mg | ORAL_TABLET | Freq: Once | ORAL | Status: DC | PRN

## 2023-09-09 MED ORDER — IBUPROFEN 200 MG PO TABS: 600.0000 mg | ORAL_TABLET | Freq: Four times a day (QID) | ORAL | 0 refills | Status: AC

## 2023-09-09 MED ORDER — HYDROMORPHONE HCL 1 MG/ML IJ SOLN: 0.2500 mg | INTRAMUSCULAR | Status: DC | PRN

## 2023-09-09 MED ORDER — DEXAMETHASONE SODIUM PHOSPHATE 10 MG/ML IJ SOLN
INTRAMUSCULAR | Status: AC
  Filled 2023-09-09: qty 1

## 2023-09-09 SURGICAL SUPPLY — 63 items
APPLICATOR COTTON TIP 6 STRL (MISCELLANEOUS) IMPLANT
APPLICATOR COTTON TIP 6IN STRL (MISCELLANEOUS) IMPLANT
BAG COUNTER SPONGE SURGICOUNT (BAG) IMPLANT
BLADE SURG 15 STRL LF DISP TIS (BLADE) ×2 IMPLANT
BLADE SURG SZ10 CARB STEEL (BLADE) IMPLANT
BLADE SURG SZ11 CARB STEEL (BLADE) ×2 IMPLANT
CHLORAPREP W/TINT 26 (MISCELLANEOUS) ×2 IMPLANT
COVER MAYO STAND STRL (DRAPES) ×2 IMPLANT
COVER SURGICAL LIGHT HANDLE (MISCELLANEOUS) ×2 IMPLANT
COVER TIP SHEARS 8 DVNC (MISCELLANEOUS) ×2 IMPLANT
DERMABOND ADVANCED .7 DNX12 (GAUZE/BANDAGES/DRESSINGS) ×2 IMPLANT
DRAPE ARM DVNC X/XI (DISPOSABLE) ×6 IMPLANT
DRAPE COLUMN DVNC XI (DISPOSABLE) ×2 IMPLANT
DRAPE LAPAROSCOPIC ABDOMINAL (DRAPES) ×2 IMPLANT
DRAPE UTILITY XL STRL (DRAPES) ×2 IMPLANT
DRIVER NDL MEGA SUTCUT DVNCXI (INSTRUMENTS) ×2 IMPLANT
DRIVER NDLE MEGA SUTCUT DVNCXI (INSTRUMENTS) IMPLANT
ELECT PENCIL ROCKER SW 15FT (MISCELLANEOUS) ×2 IMPLANT
ELECT REM PT RETURN 15FT ADLT (MISCELLANEOUS) ×2 IMPLANT
FORCEPS BPLR 8 MD DVNC XI (FORCEP) ×2 IMPLANT
GAUZE 4X4 16PLY ~~LOC~~+RFID DBL (SPONGE) ×2 IMPLANT
GLOVE BIO SURGEON STRL SZ7 (GLOVE) ×4 IMPLANT
GLOVE INDICATOR 7.5 STRL GRN (GLOVE) ×2 IMPLANT
GOWN STRL REUS W/ TWL XL LVL3 (GOWN DISPOSABLE) ×4 IMPLANT
GRASPER SUT TROCAR 14GX15 (MISCELLANEOUS) IMPLANT
IRRIGATION SUCT STRKRFLW 2 WTP (MISCELLANEOUS) IMPLANT
KIT BASIN OR (CUSTOM PROCEDURE TRAY) ×2 IMPLANT
KIT TURNOVER KIT A (KITS) IMPLANT
MARKER SKIN DUAL TIP RULER LAB (MISCELLANEOUS) ×2 IMPLANT
MESH 3DMAX MID 5X7 LT XLRG (Mesh General) IMPLANT
NDL HYPO 22X1.5 SAFETY MO (MISCELLANEOUS) ×2 IMPLANT
NDL HYPO 25X1 1.5 SAFETY (NEEDLE) ×2 IMPLANT
NDL INSUFFLATION 14GA 120MM (NEEDLE) ×2 IMPLANT
NEEDLE HYPO 22X1.5 SAFETY MO (MISCELLANEOUS) ×2 IMPLANT
NEEDLE HYPO 25X1 1.5 SAFETY (NEEDLE) ×2 IMPLANT
NEEDLE INSUFFLATION 14GA 120MM (NEEDLE) ×2 IMPLANT
OBTURATOR OPTICALSTD 8 DVNC (TROCAR) ×2 IMPLANT
PACK BASIC VI WITH GOWN DISP (CUSTOM PROCEDURE TRAY) ×2 IMPLANT
PACK CARDIOVASCULAR III (CUSTOM PROCEDURE TRAY) ×2 IMPLANT
SCISSORS MNPLR CVD DVNC XI (INSTRUMENTS) ×2 IMPLANT
SEAL UNIV 5-12 XI (MISCELLANEOUS) ×6 IMPLANT
SOLUTION ANTFG W/FOAM PAD STRL (MISCELLANEOUS) ×2 IMPLANT
SOLUTION ELECTROSURG ANTI STCK (MISCELLANEOUS) ×2 IMPLANT
SPIKE FLUID TRANSFER (MISCELLANEOUS) ×2 IMPLANT
SPONGE T-LAP 18X18 ~~LOC~~+RFID (SPONGE) ×2 IMPLANT
SUT MNCRL AB 4-0 PS2 18 (SUTURE) ×2 IMPLANT
SUT NOVA NAB DX-16 0-1 5-0 T12 (SUTURE) IMPLANT
SUT PDS AB 1 CT1 27 (SUTURE) IMPLANT
SUT STRATA PDS 2-0 23 CT-1 (SUTURE) IMPLANT
SUT STRATAFIX SPIRAL PDS3-0 (SUTURE) ×2 IMPLANT
SUT VIC AB 2-0 SH 27X BRD (SUTURE) ×2 IMPLANT
SUT VIC AB 3-0 SH 18 (SUTURE) ×2 IMPLANT
SUT VIC AB 3-0 SH 27X BRD (SUTURE) IMPLANT
SUT VICRYL 0 TIES 12 18 (SUTURE) IMPLANT
SYR 10ML LL (SYRINGE) ×2 IMPLANT
SYR 20ML LL LF (SYRINGE) ×2 IMPLANT
SYR BULB IRRIG 60ML STRL (SYRINGE) IMPLANT
TAPE STRIPS DRAPE STRL (GAUZE/BANDAGES/DRESSINGS) ×2 IMPLANT
TOWEL GREEN STERILE FF (TOWEL DISPOSABLE) ×2 IMPLANT
TOWEL OR 17X26 10 PK STRL BLUE (TOWEL DISPOSABLE) ×2 IMPLANT
TROCAR Z-THREAD OPTICAL 5X100M (TROCAR) IMPLANT
TUBING INSUFFLATION 10FT LAP (TUBING) ×2 IMPLANT
YANKAUER SUCT BULB TIP 10FT TU (MISCELLANEOUS) ×2 IMPLANT

## 2023-09-09 NOTE — Transfer of Care (Signed)
 Immediate Anesthesia Transfer of Care Note  Patient: Donalynn Fry  Procedure(s) Performed: REPAIR, HERNIA, INGUINAL, ROBOT-ASSISTED, LAPAROSCOPIC, USING MESH (Left) REPAIR, HERNIA, UMBILICAL, ADULT  Patient Location: PACU  Anesthesia Type:General  Level of Consciousness: awake and pateint uncooperative  Airway & Oxygen Therapy: Patient Spontanous Breathing and Patient connected to face mask oxygen  Post-op Assessment: Report given to RN and Post -op Vital signs reviewed and stable  Post vital signs: Reviewed and stable  Last Vitals:  Vitals Value Taken Time  BP 163/76 09/09/23 1521  Temp    Pulse 83 09/09/23 1526  Resp 18 09/09/23 1526  SpO2 96 % 09/09/23 1526  Vitals shown include unfiled device data.  Last Pain:  Vitals:   09/09/23 1136  TempSrc: Oral  PainSc:          Complications: No notable events documented.

## 2023-09-09 NOTE — Discharge Instructions (Signed)

## 2023-09-09 NOTE — H&P (Signed)
     Luis Yoder 1961-01-11  [id number].    HPI:  63 y/o M w/ a large left inguinal hernia with extension into the scrotum who presents for elective repair.  He reports that he is in his usual state of health and denies any recent changes in medication.   ROS: Review of Systems  Constitutional: Negative.   HENT: Negative.    Eyes: Negative.   Respiratory: Negative.    Cardiovascular: Negative.   Gastrointestinal: Negative.   Genitourinary: Negative.   Musculoskeletal: Negative.   Skin: Negative.   Neurological: Negative.   Endo/Heme/Allergies: Negative.   Psychiatric/Behavioral: Negative.      History reviewed. No pertinent family history.  Past Medical History:  Diagnosis Date   Complication of anesthesia    Super sore( Generalized) after a prostate surgery .   GERD (gastroesophageal reflux disease)    History of kidney stones    Hypertension     Past Surgical History:  Procedure Laterality Date   LITHOTRIPSY     LITHOTRIPSY     PROSTATE SURGERY     enlarged prostate    Social History:  reports that he has never smoked. He does not have any smokeless tobacco history on file. He reports that he does not drink alcohol and does not use drugs.  Allergies:  Allergies  Allergen Reactions   Sulfa Antibiotics Other (See Comments)    Childhood reaction-Reaction unknown     Medications Prior to Admission  Medication Sig Dispense Refill   ibuprofen (ADVIL,MOTRIN) 200 MG tablet Take 600 mg by mouth every 8 (eight) hours as needed (pain/headaches).     losartan (COZAAR) 50 MG tablet Take 50 mg by mouth in the morning.     omeprazole (PRILOSEC) 20 MG capsule Take 20 mg by mouth every evening.      Physical Exam: Blood pressure (!) 148/64, pulse 71, temperature 98.6 F (37 C), temperature source Oral, resp. rate 16, height 5\' 9"  (1.753 m), weight 82.1 kg, SpO2 96%. Gen: male, NAD Abd: soft, non-distended, non-tender Groin: Large left inguinal hernia with scrotal  component, side marked  No results found for this or any previous visit (from the past 48 hours). No results found.  Assessment/Plan 63 y/o M w/ a large left inguinal hernia with extension into the scrotum  - Will proceed to the OR. We discussed the alternatives and potential risks of surgery, including but not limited to: bleeding, infection, damage to bowel or surrounding structures, mesh complications, chronic pain, and recurrent hernia. We did discuss that given the size of his hernia he is at an increased risk of needing to convert to an open operation and damage to his testicle/need for orchiectomy. He also has an umbilical hernia that I will try to repair during the operation if feasible. All questions were addressed and consent was obtained.    Trula [facility] 09/09/2023, 12:36 PM Please see Amion for pager number during day hours 7:00am-4:30pm or 7:00am -11:30am on weekends

## 2023-09-09 NOTE — Op Note (Signed)
 Siris Graus ([id number])  Operative Report   Date 09/09/2023  PREOPERATIVE DIAGNOSIS: Left Inguinal hernia  POSTOPERATIVE DIAGNOSIS: Same  PROCEDURE:  Robotic Left Inguinal Hernia Repair with Mesh (Bard 3D Midweight XL Left Polypropylene Mesh) Left-sided transversus abdominus plane block Primary repair of umbilical hernia    SURGEON: Armond Bertin, MD  ANESTHESIA: General Anesthesia    INTRAOPERATIVE FINDINGS: Large scrotal left-sided hernia containing small bowel. Small umbilical hernia.   IMPLANTS: Implant Name Type Inv. Item Serial No. Manufacturer Lot No. LRB No. Used Action  MESH 3DMAX MID 5X7 LT XLRG - [id number] Mesh General MESH 3DMAX MID 5X7 LT XLRG  [employer] BARD ACCESS [id number] Left 1 Implanted    ESTIMATED BLOOD LOSS: Minimal  COMPLICATIONS: None  SPECIMENS: None  OPERATIVE INDICATIONS: Pt is a 63 y.o. male who presents with a large, left inguinal hernia with extension into the scrotum.  The patient desires definitive repair.  The procedure's risks, benefits, and alternatives were explained to the patient.  Risks, including the risks of bleeding, infection, need for mesh removal, and potential for hernia recurrence, were discussed.  The patient agreed to proceed and signed informed consent in front of a witness.   DESCRIPTION OF PROCEDURE: After preoperatively identifying the patient in holding, the patient was brought to the operating room and placed supine on the operating room table.  Both arms were tucked and padded to avoid potential nerve injury.  Sequential Compression Devices were placed bilaterally.  After induction of general anesthesia, the patient was given the appropriate perioperative antibiotics.  The abdomen was prepped and draped in a typical sterile fashion.  A JACHO approved time out, where the name of the patient, the operation, and the intended site were confirmed. The abdomen was accessed with a Veress needle via the standard drop technique in the  LUQ at Palmer's point.  Insufflation was established.  An 8 mm optical port was placed under direct visualization in the LLQ.  A camera was introduced into the abdomen, and a thorough inspection of the abdomen was performed to confirm there was no additional pathology.  Two additional 8 mm robotic ports were placed laterally under direct visualization, one within the umbilical hernia dn another in the RLQ. The patient was then placed into 15-degree Trendelenburg position to facilitate examination of the bilateral inguinal spaces.  A thorough inspection of the abdomen was undertaken.  A large, left inguinal hernia was identified and amenable to robotic repair. I performed a left-sided transversus abdominus plane block using a mixture of liposomal bupivacaine and 0.25% marcaine with epinephrine to aid with post-operative pain.       The [employer] robot was brought into the field and docked.  An 8 mm 30-degree scope was placed in the mid-abdominal port.  There was small bowel that remained herniated through the defect. This was reduced with gentle traction. There was no evidence of damage to the bowel. The peritoneum was taken down 6 cm superior to the hernia from the anterior abdominal wall, and dissection was taken inferiorly towards the hernia.  Medial to the epigastric vessels, the parietal compartment was dissected and completed to visualize the rectus muscle.  This dissection was carried down to the symphysis pubis and obturator foramen.  The retropubic space was dissected to expose at least 2 cm contralateral to the midline.  Cooper's ligament was exposed and cleared at least 2 cm below the ligament to ensure adequate space for the inferior border of the mesh.  Hesselbach's triangle was cleared,  assessing for a direct hernia. There was no evidence of a direct hernia.  Any herniated femoral contents were reduced as well.  Lateral to the epigastric vessels, the dissection was carried out into the visceral  compartment, continuing in the true preperitoneal plane.  The indirect hernia sac was carefully reduced and separated from the cord structures with medial retraction and a combination of blunt/sharp dissection and focused cautery.  This dissection continued until the cord structures were parietalized completely, allowing for continuous visualization of the reflected peritoneum with the line originating 2 cm below Cooper's medially and across the psoas muscle in the lateral compartment.  The internal ring was interrogated for a cord lipoma.  Any component of cord lipoma was reduced to the retroperitoneum and seated dorsal to the preperitoneal mesh.  Having achieved a complete dissection with a critical view of the entire myopectineal orifice, a piece of Bard 3D Midweight XL Left Polypropylene Mesh was introduced into the field.  It was centered at the iliopubic tract, with the medial side crossing the midline and the inferior edge positioned 2 cm below Cooper's ligament.  With complete coverage of the myopectineal orifice, the inferior edge of the peritoneum was posterior and inferior to the mesh.  The lateral aspect of the mesh extended 3-5 cm beyond the lateral edge of the psoas.  Cephalad retraction of the peritoneal flap did not cause lifting of the inferior mesh edge or cord structures.  The mesh was fixated using an interrupted 3-0 Vicryl suture placed to the ipsilateral Coopers ligament.  The peritoneal flap was closed with a running 3-0 Stratafix barbed suture.   I then turned my attention to the closure of his umbilical hernia.  Using a suture passer, I placed a figure of eight suture using 0 vicryl to close the defect under direct visualization. Hemostasis was assured in the entirety of the abdomen.  The two lateral ports were removed under direct visualization.  The abdomen was desufflated under direct visualization.  The port sites were closed, local anesthetic was infiltrated, and Dermabond was  applied.  After confirming twice that the sponge, needle, and instrument counts were correct, the procedure was terminated, the patient was extubated and transferred to the recovery room in stable condition.     DISPOSITION: Stable to PACU.   Electronically Signed By: Cannon Champion 09/09/2023 3:18 PM

## 2023-09-09 NOTE — Anesthesia Postprocedure Evaluation (Signed)
 Anesthesia Post Note  Patient: Luis Yoder  Procedure(s) Performed: REPAIR, HERNIA, INGUINAL, ROBOT-ASSISTED, LAPAROSCOPIC, USING MESH (Left) REPAIR, HERNIA, UMBILICAL, ADULT     Patient location during evaluation: PACU Anesthesia Type: General Level of consciousness: awake and alert Pain management: pain level controlled Vital Signs Assessment: post-procedure vital signs reviewed and stable Respiratory status: spontaneous breathing, nonlabored ventilation, respiratory function stable and patient connected to nasal cannula oxygen Cardiovascular status: blood pressure returned to baseline and stable Postop Assessment: no apparent nausea or vomiting Anesthetic complications: no  No notable events documented.  Last Vitals:  Vitals:   09/09/23 1615 09/09/23 1630  BP: (!) 120/59 (!) 114/56  Pulse: 64 (!) 59  Resp: 15 12  Temp: (!) 36.4 C   SpO2: 91% 95%    Last Pain:  Vitals:   09/09/23 1630  TempSrc:   PainSc: 0-No pain                 Rosalita Combe

## 2023-09-09 NOTE — Anesthesia Procedure Notes (Signed)
 Procedure Name: Intubation Date/Time: 09/09/2023 1:30 PM  Performed by: Josetta Niece, CRNAPre-anesthesia Checklist: Patient identified, Emergency Drugs available, Suction available and Patient being monitored Patient Re-evaluated:Patient Re-evaluated prior to induction Oxygen Delivery Method: Circle System Utilized Preoxygenation: Pre-oxygenation with 100% oxygen Induction Type: IV induction Ventilation: Mask ventilation without difficulty Laryngoscope Size: Mac and 3 Grade View: Grade I Tube type: Oral Tube size: 8.0 mm Number of attempts: 1 Airway Equipment and Method: Stylet Placement Confirmation: ETT inserted through vocal cords under direct vision, positive ETCO2 and breath sounds checked- equal and bilateral Secured at: 23 cm Tube secured with: Tape Dental Injury: Teeth and Oropharynx as per pre-operative assessment

## 2023-09-10 ENCOUNTER — Encounter (HOSPITAL_COMMUNITY): Payer: Self-pay | Admitting: General Surgery

## 2023-09-11 ENCOUNTER — Encounter (HOSPITAL_COMMUNITY): Payer: Self-pay | Admitting: General Surgery

## 2023-12-17 ENCOUNTER — Other Ambulatory Visit: Payer: Self-pay | Admitting: General Surgery

## 2023-12-17 DIAGNOSIS — K4090 Unilateral inguinal hernia, without obstruction or gangrene, not specified as recurrent: Secondary | ICD-10-CM

## 2023-12-20 ENCOUNTER — Ambulatory Visit
Admission: RE | Admit: 2023-12-20 | Discharge: 2023-12-20 | Disposition: A | Discharge status: Home / Self Care | Source: Ambulatory Visit | Attending: General Surgery | Admitting: General Surgery

## 2023-12-20 ENCOUNTER — Encounter: Payer: Self-pay | Admitting: Radiology

## 2023-12-20 DIAGNOSIS — K4090 Unilateral inguinal hernia, without obstruction or gangrene, not specified as recurrent: Secondary | ICD-10-CM

## 2023-12-20 MED ORDER — IOPAMIDOL (ISOVUE-300) INJECTION 61%
100.0000 mL | Freq: Once | INTRAVENOUS | Status: AC | PRN
  Administered 2023-12-20: 100 mL via INTRAVENOUS
